# Patient Record
Sex: Female | Born: 1995 | Race: White | Hispanic: No | Marital: Single | Smoking: Current every day smoker
Health system: Southern US, Community
[De-identification: ages and names within clinical notes are randomized; demographics above are authoritative.]

## PROBLEM LIST (undated history)

## (undated) ENCOUNTER — Ambulatory Visit: Admission: EM | Source: Home / Self Care

## (undated) ENCOUNTER — Inpatient Hospital Stay (HOSPITAL_COMMUNITY): Payer: Self-pay

## (undated) DIAGNOSIS — J45909 Unspecified asthma, uncomplicated: Secondary | ICD-10-CM

## (undated) HISTORY — PX: WISDOM TOOTH EXTRACTION: SHX21

---
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
## 2013-03-29 ENCOUNTER — Emergency Department (HOSPITAL_COMMUNITY): Payer: Medicaid Other

## 2013-03-29 ENCOUNTER — Emergency Department (HOSPITAL_COMMUNITY)
Admission: EM | Admit: 2013-03-29 | Discharge: 2013-03-29 | Disposition: A | Discharge status: Home / Self Care | Payer: Medicaid Other | Attending: Emergency Medicine | Admitting: Emergency Medicine

## 2013-03-29 ENCOUNTER — Encounter (HOSPITAL_COMMUNITY): Payer: Self-pay | Admitting: Emergency Medicine

## 2013-03-29 DIAGNOSIS — F17200 Nicotine dependence, unspecified, uncomplicated: Secondary | ICD-10-CM | POA: Insufficient documentation

## 2013-03-29 DIAGNOSIS — S39012A Strain of muscle, fascia and tendon of lower back, initial encounter: Secondary | ICD-10-CM

## 2013-03-29 DIAGNOSIS — Y929 Unspecified place or not applicable: Secondary | ICD-10-CM | POA: Insufficient documentation

## 2013-03-29 DIAGNOSIS — Y9389 Activity, other specified: Secondary | ICD-10-CM | POA: Insufficient documentation

## 2013-03-29 DIAGNOSIS — W010XXA Fall on same level from slipping, tripping and stumbling without subsequent striking against object, initial encounter: Secondary | ICD-10-CM | POA: Insufficient documentation

## 2013-03-29 DIAGNOSIS — S335XXA Sprain of ligaments of lumbar spine, initial encounter: Secondary | ICD-10-CM | POA: Insufficient documentation

## 2013-03-29 MED ORDER — HYDROCODONE-ACETAMINOPHEN 5-325 MG PO TABS: 1.0000 | ORAL_TABLET | ORAL | Status: DC | PRN

## 2013-03-29 MED ORDER — IBUPROFEN 600 MG PO TABS: 600.0000 mg | ORAL_TABLET | Freq: Four times a day (QID) | ORAL | Status: DC | PRN

## 2013-03-29 NOTE — ED Notes (Signed)
Pain low back , playing with child outside, Was wearing flip flops and fell .  Pain L-s spine . Increased pain with motion.

## 2013-03-29 NOTE — Discharge Instructions (Signed)
Lumbosacral Strain Lumbosacral strain is a strain of any of the parts that make up your lumbosacral vertebrae. Your lumbosacral vertebrae are the bones that make up the lower third of your backbone. Your lumbosacral vertebrae are held together by muscles and tough, fibrous tissue (ligaments).  CAUSES  A sudden blow to your back can cause lumbosacral strain. Also, anything that causes an excessive stretch of the muscles in the low back can cause this strain. This is typically seen when people exert themselves strenuously, fall, lift heavy objects, bend, or crouch repeatedly. RISK FACTORS  Physically demanding work.  Participation in pushing or pulling sports or sports that require sudden twist of the back (tennis, golf, baseball).  Weight lifting.  Excessive lower back curvature.  Forward-tilted pelvis.  Weak back or abdominal muscles or both.  Tight hamstrings. SIGNS AND SYMPTOMS  Lumbosacral strain may cause pain in the area of your injury or pain that moves (radiates) down your leg.  DIAGNOSIS Your health care provider can often diagnose lumbosacral strain through a physical exam. In some cases, you may need tests such as X-ray exams.  TREATMENT  Treatment for your lower back injury depends on many factors that your clinician will have to evaluate. However, most treatment will include the use of anti-inflammatory medicines. HOME CARE INSTRUCTIONS   Avoid hard physical activities (tennis, racquetball, waterskiing) if you are not in proper physical condition for it. This may aggravate or create problems.  If you have a back problem, avoid sports requiring sudden body movements. Swimming and walking are generally safer activities.  Maintain good posture.  Maintain a healthy weight.  For acute conditions, you may put ice on the injured area.  Put ice in a plastic bag.  Place a towel between your skin and the bag.  Leave the ice on for 10 minutes every hour while awake for the  next 2 days.    You may add heat on Sunday,  20 minutes 3-4 times daily  When the low back starts healing, stretching and strengthening exercises may be recommended. SEEK MEDICAL CARE IF:  Your back pain is getting worse.  You experience severe back pain not relieved with medicines. SEEK IMMEDIATE MEDICAL CARE IF:   You have numbness, tingling, weakness, or problems with the use of your arms or legs.  There is a change in bowel or bladder control.  You have increasing pain in any area of the body, including your belly (abdomen).  You notice shortness of breath, dizziness, or feel faint.  You feel sick to your stomach (nauseous), are throwing up (vomiting), or become sweaty.  You notice discoloration of your toes or legs, or your feet get very cold. MAKE SURE YOU:   Understand these instructions.  Will watch your condition.  Will get help right away if you are not doing well or get worse. Document Released: 10/06/2004 Document Revised: 10/17/2012 Document Reviewed: 08/15/2012 [facility] Patient Information 2014 [employer], [place].   You may take the hydrocodone prescribed for pain relief.  This will make you drowsy - do not drive within 4 hours of taking this medication.

## 2013-03-29 NOTE — ED Notes (Signed)
Pt c/o lower back pain after slipping and falling today.

## 2013-04-01 NOTE — ED Provider Notes (Signed)
CSN: [id number]     Arrival date & time 03/29/13  1433 History   First MD Initiated Contact with Patient 03/29/13 1519     Chief Complaint  Patient presents with  . Back Pain     (Consider location/radiation/quality/duration/timing/severity/associated sxs/prior Treatment) HPI Comments: Judith Poole is a 18 y.o. Female presenting with low back pain since slipping on wet grass while playing with her daughter, landing on her buttocks.  She reports pain in her lumbar and paralumbar area which is worsened by movement and better at rest.  She denies prior history of low back pain and has had no radiation of pain and weakness or numbness in her lower extremities.  She has had no problems with urination since the event.  She has taken ibuprofen 400mg  with no relief of pain yet.   The history is provided by the patient.    History reviewed. No pertinent past medical history. Past Surgical History  Procedure Laterality Date  . Wisdom tooth extraction    . Cesarean section     No family history on file. History  Substance Use Topics  . Smoking status: Current Every Day Smoker -- 0.50 packs/day    Types: Cigarettes  . Smokeless tobacco: Never Used  . Alcohol Use: No   OB History   Grav Para Term Preterm Abortions TAB SAB Ect Mult Living                 Review of Systems  Constitutional: Negative for fever.  Respiratory: Negative for shortness of breath.   Cardiovascular: Negative for chest pain and leg swelling.  Gastrointestinal: Negative for abdominal pain, constipation and abdominal distention.  Genitourinary: Negative for dysuria, urgency, frequency, flank pain and difficulty urinating.  Musculoskeletal: Positive for back pain. Negative for gait problem and joint swelling.  Skin: Negative for rash.  Neurological: Negative for weakness and numbness.      Allergies  Review of patient's allergies indicates not on file.  Home Medications   Current Outpatient Rx  Name   Route  Sig  Dispense  Refill  . ibuprofen (ADVIL,MOTRIN) 200 MG tablet   Oral   Take 200 mg by mouth every 6 (six) hours as needed.         Marland Kitchen HYDROcodone-acetaminophen (NORCO/VICODIN) 5-325 MG per tablet   Oral   Take 1 tablet by mouth every 4 (four) hours as needed.   20 tablet   0   . ibuprofen (ADVIL,MOTRIN) 600 MG tablet   Oral   Take 1 tablet (600 mg total) by mouth every 6 (six) hours as needed.   20 tablet   0    BP 145/83  Pulse 84  Temp(Src) 98.5 F (36.9 C)  Resp 18  SpO2 100%  LMP 03/15/2013 Physical Exam  Nursing note and vitals reviewed. Constitutional: She appears well-developed and well-nourished.  HENT:  Head: Normocephalic.  Eyes: Conjunctivae are normal.  Neck: Normal range of motion. Neck supple.  Cardiovascular: Normal rate and intact distal pulses.   Pedal pulses normal.  Pulmonary/Chest: Effort normal.  Abdominal: Soft. Bowel sounds are normal. She exhibits no distension and no mass.  Musculoskeletal: Normal range of motion. She exhibits no edema.       Lumbar back: She exhibits tenderness and bony tenderness. She exhibits no swelling, no edema and no spasm.  Midline and parlumber ttp.  No edema, no spasm.  Neurological: She is alert. She has normal strength. She displays no atrophy and no tremor. No sensory deficit.  Gait normal.  Reflex Scores:      Patellar reflexes are 2+ on the right side and 2+ on the left side.      Achilles reflexes are 2+ on the right side and 2+ on the left side. No strength deficit noted in hip and knee flexor and extensor muscle groups.  Ankle flexion and extension intact.  Skin: Skin is warm and dry.  Psychiatric: She has a normal mood and affect.    ED Course  Procedures (including critical care time) Labs Review Labs Reviewed - No data to display Imaging Review No results found.   EKG Interpretation None      MDM   Final diagnoses:  Strain of lumbar spine    Patients labs and/or radiological  studies were viewed and considered during the medical decision making and disposition process.  No neuro deficit on exam or by history to suggest emergent or surgical presentation.  Also discussed worsened sx that should prompt immediate re-evaluation including distal weakness, bowel/bladder retention/incontinence.  Pt prescribed ibuprofen, hydrocodone.  Encouraged ice tx x2 days,  Adding heat tx in several days.  Resource guide given for establishing pcp.  Encouraged recheck if not better over 10 days.          Burgess AmorJulie Ronneisha Jett, PA-C 04/01/13 1358

## 2013-04-08 NOTE — ED Provider Notes (Signed)
History/physical exam/procedure(s) were performed by non-physician practitioner and as supervising physician I was immediately available for consultation/collaboration. I have reviewed all notes and am in agreement with care and plan.   Lauran Romanski S Damarco Keysor, MD 04/08/13 1117 

## 2014-04-29 NOTE — Op Note (Signed)
PATIENT NAME:  Judith Poole, Avonne E MR#:  [id number] DATE OF BIRTH:  [birth year]  DATE OF PROCEDURE:  10/10/2011  PREOPERATIVE DIAGNOSES:  1. Term intrauterine pregnancy. 2. Failure to progress.  POSTOPERATIVE DIAGNOSES:  1. Term intrauterine pregnancy. 2. Failure to progress.   PROCEDURE: Low transverse Cesarean section.   SURGEON: Dierdre Searles. Paul Elisha Mcgruder, MD   ASSISTANT: [facility]   ANESTHESIA: Epidural.   ESTIMATED BLOOD LOSS: 250 mL.   COMPLICATIONS: None.   FINDINGS: Normal tubes, ovaries, and uterus. Viable female weighing 6 pounds, 13 ounces with Apgar scores of 8 and 9 at one and five minutes respectively.   DISPOSITION: To recovery room in stable condition.   TECHNIQUE: The patient is prepped and draped in the usual sterile fashion after adequate anesthesia is obtained in the supine position on the operating room table with a Foley catheter in place. Scalpel was used to create a low transverse skin incision followed by dissection down to the level of the rectus fascia which is dissected bilaterally using Mayo scissors. Rectus muscles are separated from the rectus fascia and then separated in the midline. The peritoneum is penetrated and the serosa over the lower uterine segment is dissected inferiorly with retraction of the bladder in an inferior direction. A scalpel is then used to create a low transverse hysterotomy incision that is then extended by blunt dissection. Amniotomy reveals clear fluid. The infant's head is grasped and delivered without the use of a vacuum device. The oropharynx is suctioned, nuchal cord is reduced, and the infant is then completely delivered. Cord is clamped.   Umbilical cord blood is obtained and the placenta is manually extracted. The uterus is externalized and cleansed of all membranes and debris using a moist sponge. Hysterotomy incision is closed with a running 1 Vicryl suture in a locking fashion followed by a second layer to imbricate the first  layer. Excellent hemostasis is noted. The uterus is placed back in the intraabdominal cavity and the paracolic gutters are irrigated with warm saline. Re-examination of the incision reveals excellent hemostasis. The peritoneum is closed with a 1-0 Vicryl suture.   The On-Q pain pump is then placed with trocars placed through the abdomen into the subfascial space and then the Silver soaker catheters are fed into this space. The rectus fascia is closed with 0 Maxon suture. Subcutaneous tissues are irrigated and hemostasis is assured using electrocautery. Skin is closed with a dissolvable staple applicator followed by Dermabond. Dermabond is also used to stabilize the catheters in place and then Steri-Strips are applied to hold the catheters in place. The catheters are flushed with 5 mL each of bupivacaine 0.5%.   The patient tolerates the procedure well and goes to the recovery room in stable condition. All sponge, instrument, and needle counts are correct.    ____________________________ R. Annamarie MajorPaul Jb Dulworth, MD rph:drc D: 10/10/2011 06:28:07 ET T: 10/10/2011 09:19:25 ET JOB#: [id number]  cc: Dierdre Searles. Paul Keishawn Darsey, MD, <Dictator> Nadara MustardOBERT P Fortunato Nordin MD ELECTRONICALLY SIGNED 10/11/2011 8:17

## 2014-05-20 NOTE — H&P (Signed)
L&D Evaluation:  History Expanded:   HPI 19 yo G1 at 23 weeks with EDD of 10/07/11 per 9 week US, presents with c/o upper abdominal pain that started today after waking up. She ate sloppy joe last night, has not had anything yet to eat this am. Denies constipation, contractions, LOF or VB. Reports + FM[facility], early entry to care at 8 weeks, Tx for + Chlamydia with neg TOC 3/15, exercise-induced asthma with normal peak flows, BMI 32 with normal early glucola and smoking 7 cig/day.    Blood Type O positive    Group B Strep Results (Result >5wks must be treated as unknown) unknown/result > 5 weeks ago    Maternal HIV Negative    Maternal Syphilis Ab Nonreactive    Maternal Varicella Immune    Rubella Results immune    Patient's Medical History No Chronic Illness  "bordeline diabetic"    Patient's Surgical History none    Medications Pre Natal Vitamins    Allergies NKDA    Social History tobacco    Family History Non-Contributory   ROS:   ROS see HPI   Exam:   Vital Signs stable    General no apparent distress    Mental Status clear    Abdomen gravid, diffuse tenderness bilateral upper quadrants, no masses or rebound tenderness    Edema no edema    Pelvic deferred    FHT FHR dopplar    Ucx absent   Impression:   Impression probable gas pains   Plan:   Comments Pt will try drinking sprite to relieve gas pain/settle stomach, if unchanged may consider further work-up.   Electronic Signatures: BrothersMarta Lamas, Sharda Keddy K (CNM)  (Signed 30-May-13 12:32)  Authored: L&D Evaluation   Last Updated: 30-May-13 12:32 by Vella KohlerBrothers, Lauro Manlove K (CNM)

## 2014-05-20 NOTE — H&P (Signed)
L&D Evaluation:  History Expanded:   HPI IUP at 37 5/7 weeks, with EDD of 10/05/11, presents with c/o gush of fluid today[facility]    Patient's Medical History Asthma    Medications Pre Natal Vitamins    Allergies NKDA   ROS:   ROS All systems were reviewed.  HEENT, CNS, GI, GU, Respiratory, CV, Renal and Musculoskeletal systems were found to be normal.   Exam:   Vital Signs stable    General no apparent distress    Mental Status clear    Abdomen gravid, non-tender    Pelvic no external lesions, cervix closed and thick, nitrizine negative, fern negative    Mebranes Intact    FHT normal rate with no decels    Ucx irregular   Impression:   Impression Intact membranes   Plan:   Plan discharge    Follow Up Appointment already scheduled. 09/23/11   Electronic Signatures: Marta AntuBrothers, Velita Quirk K (CNM)  (Signed 11-Sep-13 18:05)  Authored: L&D Evaluation   Last Updated: 11-Sep-13 18:05 by Vella KohlerBrothers, Makaiah Terwilliger K (CNM)

## 2014-07-31 ENCOUNTER — Emergency Department (HOSPITAL_COMMUNITY)
Admission: EM | Admit: 2014-07-31 | Discharge: 2014-07-31 | Disposition: A | Discharge status: Home / Self Care | Payer: Medicaid Other | Attending: Emergency Medicine | Admitting: Emergency Medicine

## 2014-07-31 ENCOUNTER — Emergency Department (HOSPITAL_COMMUNITY): Payer: Medicaid Other

## 2014-07-31 ENCOUNTER — Encounter (HOSPITAL_COMMUNITY): Payer: Self-pay | Admitting: *Deleted

## 2014-07-31 DIAGNOSIS — T148 Other injury of unspecified body region: Secondary | ICD-10-CM | POA: Insufficient documentation

## 2014-07-31 DIAGNOSIS — Y998 Other external cause status: Secondary | ICD-10-CM | POA: Insufficient documentation

## 2014-07-31 DIAGNOSIS — J45909 Unspecified asthma, uncomplicated: Secondary | ICD-10-CM | POA: Insufficient documentation

## 2014-07-31 DIAGNOSIS — Y92410 Unspecified street and highway as the place of occurrence of the external cause: Secondary | ICD-10-CM | POA: Insufficient documentation

## 2014-07-31 DIAGNOSIS — T07XXXA Unspecified multiple injuries, initial encounter: Secondary | ICD-10-CM

## 2014-07-31 DIAGNOSIS — S29001A Unspecified injury of muscle and tendon of front wall of thorax, initial encounter: Secondary | ICD-10-CM | POA: Insufficient documentation

## 2014-07-31 DIAGNOSIS — R0789 Other chest pain: Secondary | ICD-10-CM | POA: Insufficient documentation

## 2014-07-31 DIAGNOSIS — Z3202 Encounter for pregnancy test, result negative: Secondary | ICD-10-CM | POA: Insufficient documentation

## 2014-07-31 DIAGNOSIS — S4991XA Unspecified injury of right shoulder and upper arm, initial encounter: Secondary | ICD-10-CM | POA: Insufficient documentation

## 2014-07-31 DIAGNOSIS — S0990XA Unspecified injury of head, initial encounter: Secondary | ICD-10-CM | POA: Insufficient documentation

## 2014-07-31 DIAGNOSIS — Y9389 Activity, other specified: Secondary | ICD-10-CM | POA: Insufficient documentation

## 2014-07-31 DIAGNOSIS — Z720 Tobacco use: Secondary | ICD-10-CM | POA: Insufficient documentation

## 2014-07-31 HISTORY — DX: Unspecified asthma, uncomplicated: J45.909

## 2014-07-31 LAB — COMPREHENSIVE METABOLIC PANEL
ALT: 17 U/L (ref 14–54)
ANION GAP: 8 (ref 5–15)
AST: 16 U/L (ref 15–41)
Albumin: 4.7 g/dL (ref 3.5–5.0)
Alkaline Phosphatase: 60 U/L (ref 38–126)
BUN: 21 mg/dL — AB (ref 6–20)
CALCIUM: 9.2 mg/dL (ref 8.9–10.3)
CHLORIDE: 106 mmol/L (ref 101–111)
CO2: 25 mmol/L (ref 22–32)
Creatinine, Ser: 0.74 mg/dL (ref 0.44–1.00)
GFR calc Af Amer: >60 mL/min (ref >60)
GFR calc non Af Amer: >60 mL/min (ref >60)
Glucose, Bld: 93 mg/dL (ref 65–99)
Potassium: 3.7 mmol/L (ref 3.5–5.1)
Sodium: 139 mmol/L (ref 135–145)
Total Bilirubin: 0.8 mg/dL (ref 0.3–1.2)
Total Protein: 8 g/dL (ref 6.5–8.1)

## 2014-07-31 LAB — CBC WITH DIFFERENTIAL/PLATELET
Basophils Absolute: 0.1 10*3/uL (ref 0.0–0.1)
Basophils Relative: 1 % (ref 0–1)
EOS PCT: 1 % (ref 0–5)
Eosinophils Absolute: 0.1 10*3/uL (ref 0.0–0.7)
HCT: 43.9 % (ref 36.0–46.0)
HEMOGLOBIN: 15.3 g/dL — AB (ref 12.0–15.0)
Lymphocytes Relative: 27 % (ref 12–46)
Lymphs Abs: 2.9 10*3/uL (ref 0.7–4.0)
MCH: 31.7 pg (ref 26.0–34.0)
MCHC: 34.9 g/dL (ref 30.0–36.0)
MCV: 90.9 fL (ref 78.0–100.0)
MONOS PCT: 6 % (ref 3–12)
Monocytes Absolute: 0.7 10*3/uL (ref 0.1–1.0)
NEUTROS ABS: 6.9 10*3/uL (ref 1.7–7.7)
Neutrophils Relative %: 65 % (ref 43–77)
PLATELETS: 210 10*3/uL (ref 150–400)
RBC: 4.83 MIL/uL (ref 3.87–5.11)
RDW: 12.7 % (ref 11.5–15.5)
WBC: 10.5 10*3/uL (ref 4.0–10.5)

## 2014-07-31 LAB — URINALYSIS, ROUTINE W REFLEX MICROSCOPIC
BILIRUBIN URINE: NEGATIVE
GLUCOSE, UA: NEGATIVE mg/dL
HGB URINE DIPSTICK: NEGATIVE
Ketones, ur: NEGATIVE mg/dL
Leukocytes, UA: NEGATIVE
Nitrite: NEGATIVE
PH: 6 (ref 5.0–8.0)
PROTEIN: NEGATIVE mg/dL
SPECIFIC GRAVITY, URINE: 1.029 (ref 1.005–1.030)
UROBILINOGEN UA: 0.2 mg/dL (ref 0.0–1.0)

## 2014-07-31 LAB — I-STAT BETA HCG BLOOD, ED (MC, WL, AP ONLY): I-stat hCG, quantitative: <5 m[IU]/mL (ref <5)

## 2014-07-31 MED ORDER — ONDANSETRON 8 MG PO TBDP
8.0000 mg | ORAL_TABLET | Freq: Once | ORAL | Status: AC
  Administered 2014-07-31: 8 mg via ORAL
  Filled 2014-07-31: qty 1

## 2014-07-31 MED ORDER — OXYCODONE-ACETAMINOPHEN 5-325 MG PO TABS
1.0000 | ORAL_TABLET | Freq: Once | ORAL | Status: AC
  Administered 2014-07-31: 1 via ORAL
  Filled 2014-07-31: qty 1

## 2014-07-31 MED ORDER — IOHEXOL 300 MG/ML  SOLN
100.0000 mL | Freq: Once | INTRAMUSCULAR | Status: AC | PRN
  Administered 2014-07-31: 100 mL via INTRAVENOUS

## 2014-07-31 MED ORDER — OXYCODONE-ACETAMINOPHEN 5-325 MG PO TABS
1.0000 | ORAL_TABLET | Freq: Once | ORAL | Status: DC
Start: 2014-07-31 — End: 2014-11-12
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
## 2014-07-31 NOTE — Progress Notes (Addendum)
CM spoke with pt who confirms uninsured [employer] resident with no pcp.  CM discussed and provided written information for uninsured accepting pcps, discussed the importance of pcp vs EDP services for f/u care, www.needymeds.org, www.goodrx.com, discounted pharmacies and other [employer] such as [facility] , [facility], affordable care act, financial assistance, uninsured dental services, [place] med assist, DSS and  health department  Reviewed resources for [employer] uninsured accepting pcps like [facility], family medicine at [facility], [facility], [facility], local urgent care centers, [facility], [facility] family practice, general medical clinics, family services of the [place], [facility] urgent care plus others, medication resources, [facility] out patient pharmacies and housing Pt voiced understanding and appreciation of resources provided   Provided [facility] contact information Pt agreed to a referral Cm completed referral Pt to be contact by [facility] clinical liason Pt states she is not sure if her medicaid has lapse and she is not familiar with what type she had She reports her daughter still has medicaid.  Referred her to DSS and provided a list of medicaid doctors to assist her  Female at her bedside is her support system

## 2014-07-31 NOTE — Discharge Instructions (Signed)
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
## 2014-11-12 ENCOUNTER — Inpatient Hospital Stay (HOSPITAL_COMMUNITY): Payer: Medicaid Other

## 2014-11-12 ENCOUNTER — Encounter: Payer: Self-pay | Admitting: *Deleted

## 2014-11-12 ENCOUNTER — Inpatient Hospital Stay (HOSPITAL_COMMUNITY)
Admission: AD | Admit: 2014-11-12 | Discharge: 2014-11-12 | Disposition: A | Discharge status: Home / Self Care | Payer: Medicaid Other | Source: Ambulatory Visit | Attending: Obstetrics and Gynecology | Admitting: Obstetrics and Gynecology

## 2014-11-12 ENCOUNTER — Encounter (HOSPITAL_COMMUNITY): Payer: Self-pay | Admitting: *Deleted

## 2014-11-12 ENCOUNTER — Emergency Department
Admission: EM | Admit: 2014-11-12 | Discharge: 2014-11-12 | Discharge status: Against Medical Advice | Payer: Medicaid Other | Attending: Emergency Medicine | Admitting: Emergency Medicine

## 2014-11-12 DIAGNOSIS — O99331 Smoking (tobacco) complicating pregnancy, first trimester: Secondary | ICD-10-CM | POA: Insufficient documentation

## 2014-11-12 DIAGNOSIS — F17210 Nicotine dependence, cigarettes, uncomplicated: Secondary | ICD-10-CM | POA: Insufficient documentation

## 2014-11-12 DIAGNOSIS — Z3491 Encounter for supervision of normal pregnancy, unspecified, first trimester: Secondary | ICD-10-CM

## 2014-11-12 DIAGNOSIS — Z3A01 Less than 8 weeks gestation of pregnancy: Secondary | ICD-10-CM | POA: Insufficient documentation

## 2014-11-12 DIAGNOSIS — O209 Hemorrhage in early pregnancy, unspecified: Secondary | ICD-10-CM | POA: Diagnosis not present

## 2014-11-12 DIAGNOSIS — Z3A00 Weeks of gestation of pregnancy not specified: Secondary | ICD-10-CM | POA: Insufficient documentation

## 2014-11-12 DIAGNOSIS — O4691 Antepartum hemorrhage, unspecified, first trimester: Secondary | ICD-10-CM | POA: Diagnosis not present

## 2014-11-12 DIAGNOSIS — R109 Unspecified abdominal pain: Secondary | ICD-10-CM | POA: Diagnosis present

## 2014-11-12 LAB — URINALYSIS, ROUTINE W REFLEX MICROSCOPIC
Bilirubin Urine: NEGATIVE
Glucose, UA: NEGATIVE mg/dL
Ketones, ur: NEGATIVE mg/dL
LEUKOCYTES UA: NEGATIVE
NITRITE: NEGATIVE
Protein, ur: NEGATIVE mg/dL
SPECIFIC GRAVITY, URINE: 1.02 (ref 1.005–1.030)
UROBILINOGEN UA: 0.2 mg/dL (ref 0.0–1.0)
pH: 8 (ref 5.0–8.0)

## 2014-11-12 LAB — CBC
HEMATOCRIT: 42.9 % (ref 35.0–47.0)
HEMOGLOBIN: 14.3 g/dL (ref 12.0–16.0)
MCH: 31.2 pg (ref 26.0–34.0)
MCHC: 33.4 g/dL (ref 32.0–36.0)
MCV: 93.4 fL (ref 80.0–100.0)
Platelets: 213 10*3/uL (ref 150–440)
RBC: 4.6 MIL/uL (ref 3.80–5.20)
RDW: 13.3 % (ref 11.5–14.5)
WBC: 8 10*3/uL (ref 3.6–11.0)

## 2014-11-12 LAB — WET PREP, GENITAL
CLUE CELLS WET PREP: NONE SEEN
Trich, Wet Prep: NONE SEEN
Yeast Wet Prep HPF POC: NONE SEEN

## 2014-11-12 LAB — URINE MICROSCOPIC-ADD ON

## 2014-11-12 LAB — ABO/RH: ABO/RH(D): O POS

## 2014-11-12 LAB — HCG, QUANTITATIVE, PREGNANCY: HCG, BETA CHAIN, QUANT, S: 1222 m[IU]/mL — AB (ref <5)

## 2014-11-12 MED ORDER — ACETAMINOPHEN 325 MG PO TABS
650.0000 mg | ORAL_TABLET | Freq: Once | ORAL | Status: AC
  Administered 2014-11-12: 650 mg via ORAL
  Filled 2014-11-12: qty 2

## 2014-11-12 NOTE — MAU Note (Signed)
Pt presents to MAU with complaints of abdominal pain with vaginagl bleeding since this morning. + pregnancy test 3 nights ago.

## 2014-11-12 NOTE — Discharge Instructions (Signed)
Vaginal Bleeding During Pregnancy, First Trimester A small amount of bleeding (spotting) from the vagina is common in early pregnancy. Sometimes the bleeding is normal and is not a problem, and sometimes it is a sign of something serious. Be sure to tell your doctor about any bleeding from your vagina right away. HOME CARE  Watch your condition for any changes.  Follow your doctor's instructions about how active you can be.  If you are on bed rest:  You may need to stay in bed and only get up to use the bathroom.  You may be allowed to do some activities.  If you need help, make plans for someone to help you.  Write down:  The number of pads you use each day.  How often you change pads.  How soaked (saturated) your pads are.  Do not use tampons.  Do not douche.  Do not have sex or orgasms until your doctor says it is okay.  If you pass any tissue from your vagina, save the tissue so you can show it to your doctor.  Only take medicines as told by your doctor.  Do not take aspirin because it can make you bleed.  Keep all follow-up visits as told by your doctor. GET HELP IF:   You bleed from your vagina.  You have cramps.  You have labor pains.  You have a fever that does not go away after you take medicine. GET HELP RIGHT AWAY IF:   You have very bad cramps in your back or belly (abdomen).  You pass large clots or tissue from your vagina.  You bleed more.  You feel light-headed or weak.  You pass out (faint).  You have chills.  You are leaking fluid or have a gush of fluid from your vagina.  You pass out while pooping (having a bowel movement). MAKE SURE YOU:  Understand these instructions.  Will watch your condition.  Will get help right away if you are not doing well or get worse.   This information is not intended to replace advice given to you by your health care provider. Make sure you discuss any questions you have with your health care  provider.   Document Released: 05/13/2013 Document Reviewed: 05/13/2013 Elsevier Interactive Patient Education [employer]  First Trimester of Pregnancy The first trimester of pregnancy is from week 1 until the end of week 12 (months 1 through 3). During this time, your baby will begin to develop inside you. At 6-8 weeks, the eyes and face are formed, and the heartbeat can be seen on ultrasound. At the end of 12 weeks, all the baby's organs are formed. Prenatal care is all the medical care you receive before the birth of your baby. Make sure you get good prenatal care and follow all of your doctor's instructions. HOME CARE  Medicines  Take medicine only as told by your doctor. Some medicines are safe and some are not during pregnancy.  Take your prenatal vitamins as told by your doctor.  Take medicine that helps you poop (stool softener) as needed if your doctor says it is okay. Diet  Eat regular, healthy meals.  Your doctor will tell you the amount of weight gain that is right for you.  Avoid raw meat and uncooked cheese.  If you feel sick to your stomach (nauseous) or throw up (vomit):  Eat 4 or 5 small meals a day instead of 3 large meals.  Try eating a few soda crackers.  Drink liquids between meals instead of during meals.  If you have a hard time pooping (constipation):  Eat high-fiber foods like fresh vegetables, fruit, and whole grains.  Drink enough fluids to keep your pee (urine) clear or pale yellow. Activity and Exercise  Exercise only as told by your doctor. Stop exercising if you have cramps or pain in your lower belly (abdomen) or low back.  Try to avoid standing for long periods of time. Move your legs often if you must stand in one place for a long time.  Avoid heavy lifting.  Wear low-heeled shoes. Sit and stand up straight.  You can have sex unless your doctor tells you not to. Relief of Pain or Discomfort  Wear a good support bra if your  breasts are sore.  Take warm water baths (sitz baths) to soothe pain or discomfort caused by hemorrhoids. Use hemorrhoid cream if your doctor says it is okay.  Rest with your legs raised if you have leg cramps or low back pain.  Wear support hose if you have puffy, bulging veins (varicose veins) in your legs. Raise (elevate) your feet for 15 minutes, 3-4 times a day. Limit salt in your diet. Prenatal Care  Schedule your prenatal visits by the twelfth week of pregnancy.  Write down your questions. Take them to your prenatal visits.  Keep all your prenatal visits as told by your doctor. Safety  Wear your seat belt at all times when driving.  Make a list of emergency phone numbers. The list should include numbers for family, friends, the hospital, and police and fire departments. General Tips  Ask your doctor for a referral to a local prenatal class. Begin classes no later than at the start of month 6 of your pregnancy.  Ask for help if you need counseling or help with nutrition. Your doctor can give you advice or tell you where to go for help.  Do not use hot tubs, steam rooms, or saunas.  Do not douche or use tampons or scented sanitary pads.  Do not cross your legs for long periods of time.  Avoid litter boxes and soil used by cats.  Avoid all smoking, herbs, and alcohol. Avoid drugs not approved by your doctor.  Do not use any tobacco products, including cigarettes, chewing tobacco, and electronic cigarettes. If you need help quitting, ask your doctor. You may get counseling or other support to help you quit.  Visit your dentist. At home, brush your teeth with a soft toothbrush. Be gentle when you floss. GET HELP IF:  You are dizzy.  You have mild cramps or pressure in your lower belly.  You have a nagging pain in your belly area.  You continue to feel sick to your stomach, throw up, or have watery poop (diarrhea).  You have a bad smelling fluid coming from your  vagina.  You have pain with peeing (urination).  You have increased puffiness (swelling) in your face, hands, legs, or ankles. GET HELP RIGHT AWAY IF:   You have a fever.  You are leaking fluid from your vagina.  You have spotting or bleeding from your vagina.  You have very bad belly cramping or pain.  You gain or lose weight rapidly.  You throw up blood. It may look like coffee grounds.  You are around people who have [place] measles, fifth disease, or chickenpox.  You have a very bad headache.  You have shortness of breath.  You have any kind of trauma, such  as from a fall or a car accident.   This information is not intended to replace advice given to you by your health care provider. Make sure you discuss any questions you have with your health care provider.   Document Released: 06/15/2007 Document Revised: 01/17/2014 Document Reviewed: 11/06/2012 Elsevier Interactive Patient Education 2016 [employer].   Safe Medications in Pregnancy   Acne: Benzoyl Peroxide Salicylic Acid  Backache/Headache: Tylenol: 2 regular strength every 4 hours OR              2 Extra strength every 6 hours  Colds/Coughs/Allergies: Benadryl (alcohol free) 25 mg every 6 hours as needed Breath right strips Claritin Cepacol throat lozenges Chloraseptic throat spray Cold-Eeze- up to three times per day Cough drops, alcohol free Flonase (by prescription only) Guaifenesin Mucinex Robitussin DM (plain only, alcohol free) Saline nasal spray/drops Sudafed (pseudoephedrine) & Actifed ** use only after [redacted] weeks gestation and if you do not have high blood pressure Tylenol Vicks Vaporub Zinc lozenges Zyrtec   Constipation: Colace Ducolax suppositories Fleet enema Glycerin suppositories Metamucil Milk of magnesia Miralax Senokot Smooth move tea  Diarrhea: Kaopectate Imodium A-D  *NO pepto Bismol  Hemorrhoids: Anusol Anusol HC Preparation  H Tucks  Indigestion: Tums Maalox Mylanta Zantac  Pepcid  Insomnia: Benadryl (alcohol free)  every 6 hours as needed Tylenol PM Unisom, no Gelcaps  Leg Cramps: Tums MagGel  Nausea/Vomiting:  Bonine Dramamine Emetrol Ginger extract Sea bands Meclizine  Nausea medication to take during pregnancy:  Unisom (doxylamine succinate 25 mg tablets) Take one tablet daily at bedtime. If symptoms are not adequately controlled, the dose can be increased to a maximum recommended dose of two tablets daily (1/2 tablet in the morning, 1/2 tablet mid-afternoon and one at bedtime). Vitamin B6  tablets. Take one tablet twice a day (up to 200 mg per day).  Skin Rashes: Aveeno products Benadryl cream or  every 6 hours as needed Calamine Lotion 1% cortisone cream  Yeast infection: Gyne-lotrimin 7 Monistat 7   **If taking multiple medications, please check labels to avoid duplicating the same active ingredients **take medication as directed on the label ** Do not exceed 4000 mg of tylenol in 24 hours **Do not take medications that contain aspirin or ibuprofen

## 2014-11-12 NOTE — ED Notes (Signed)
Pt LMP was last month?, pt reports cramping with a large amount of blood with clots

## 2014-11-12 NOTE — ED Notes (Signed)
[facility]. RN apologetic to patient. Pt understands that treatment has begun with lab work. Pt encouraged to stay. Pt and family voice they are leaving.

## 2014-11-12 NOTE — MAU Provider Note (Signed)
History     CSN: [id number]  Arrival date and time: 11/12/14 1229   First Provider Initiated Contact with Patient 11/12/14 1341         Chief Complaint  Patient presents with  . Abdominal Pain  . Vaginal Bleeding   HPI Judith Poole is a 19 y.o. G2P1001 at [redacted]w[redacted]d who presents with abdominal pain and vaginal bleeding.  Woke up this morning with lower abdominal cramping and dark brown vaginal bleeding. Rates pain 7/10 & describes as cramp like. No treatment.  Denies n/v/d.  Denies urinary complaints.  Last intercourse 1 week ago.  Denies vaginal discharge.   OB History    Gravida Para Term Preterm AB TAB SAB Ectopic Multiple Living   Past Medical History  Diagnosis Date  . Asthma     Past Surgical History  Procedure Laterality Date  . Wisdom tooth extraction    . Cesarean section    . Cesarean section      No family history on file.  Social History  Substance Use Topics  . Smoking status: Current Every Day Smoker -- 0.50 packs/day    Types: Cigarettes  . Smokeless tobacco: Never Used  . Alcohol Use: No    Allergies: No Known Allergies  Prescriptions prior to admission  Medication Sig Dispense Refill Last Dose  . acetaminophen (TYLENOL) 325 MG tablet Take 650 mg by mouth every 6 (six) hours as needed for headache.   Past Month at Unknown time  . HYDROcodone-acetaminophen (NORCO/VICODIN) 5-325 MG per tablet Take 1 tablet by mouth every 4 (four) hours as needed. (Patient not taking: Reported on 07/31/2014) 20 tablet 0 Completed Course at Unknown time  . ibuprofen (ADVIL,MOTRIN) 200 MG tablet Take 400 mg by mouth every 6 (six) hours as needed for headache.    Past Month at Unknown time  . ibuprofen (ADVIL,MOTRIN) 600 MG tablet Take 1 tablet (600 mg total) by mouth every 6 (six) hours as needed. (Patient not taking: Reported on 07/31/2014) 20 tablet 0 Completed Course at Unknown time  . oxyCODONE-acetaminophen (PERCOCET/ROXICET) 5-325 MG per  tablet Take 1 tablet by mouth once. 30 tablet 0     Review of Systems  Constitutional: Negative.   Gastrointestinal: Positive for abdominal pain. Negative for nausea, vomiting and diarrhea.  Genitourinary: Negative for dysuria.       + vaginal bleeding   Physical Exam   Blood pressure 134/89, pulse 90, temperature 98.5 F (36.9 C), resp. rate 18, last menstrual period 10/11/2014.  Physical Exam  Nursing note and vitals reviewed. Constitutional: She is oriented to person, place, and time. She appears well-developed and well-nourished. No distress.  HENT:  Head: Normocephalic and atraumatic.  Eyes: Conjunctivae are normal. Right eye exhibits no discharge. Left eye exhibits no discharge. No scleral icterus.  Neck: Normal range of motion.  Cardiovascular: Normal rate, regular rhythm and normal heart sounds.   No murmur heard. Respiratory: Effort normal and breath sounds normal. No respiratory distress. She has no wheezes.  GI: Soft. Bowel sounds are normal. She exhibits no distension. There is tenderness (lower abdomen).  Genitourinary: Vagina normal and uterus normal. Cervix exhibits no motion tenderness and no friability.  Cervix closed Small amount of dark red blood in canal  Neurological: She is alert and oriented to person, place, and time.  Skin: Skin is warm and dry. She is not diaphoretic.  Psychiatric: She has a normal mood  and affect. Her behavior is normal. Judgment and thought content normal.    MAU Course  Procedures Results for orders placed or performed during the hospital encounter of 11/12/14 (from the past 24 hour(s))  Urinalysis, Routine w reflex microscopic (not [facility])     Status: Abnormal   Collection Time: 11/12/14  1:10 PM  Result Value Ref Range   Color, Urine YELLOW YELLOW   APPearance CLEAR CLEAR   Specific Gravity, Urine 1.020 1.005 - 1.030   pH 8.0 5.0 - 8.0   Glucose, UA NEGATIVE NEGATIVE mg/dL   Hgb urine dipstick MODERATE (A) NEGATIVE    Bilirubin Urine NEGATIVE NEGATIVE   Ketones, ur NEGATIVE NEGATIVE mg/dL   Protein, ur NEGATIVE NEGATIVE mg/dL   Urobilinogen, UA 0.2 0.0 - 1.0 mg/dL   Nitrite NEGATIVE NEGATIVE   Leukocytes, UA NEGATIVE NEGATIVE  Urine microscopic-add on     Status: Abnormal   Collection Time: 11/12/14  1:10 PM  Result Value Ref Range   Squamous Epithelial / LPF FEW (A) RARE   RBC / HPF 0-2 <3 RBC/hpf  ABO/Rh     Status: None   Collection Time: 11/12/14  1:30 PM  Result Value Ref Range   ABO/RH(D) O POS   Wet prep, genital     Status: Abnormal   Collection Time: 11/12/14  1:50 PM  Result Value Ref Range   Yeast Wet Prep HPF POC NONE SEEN NONE SEEN   Trich, Wet Prep NONE SEEN NONE SEEN   Clue Cells Wet Prep HPF POC NONE SEEN NONE SEEN   WBC, Wet Prep HPF POC FEW (A) NONE SEEN   [place] Ob Comp Less 14 Wks  11/12/2014  CLINICAL DATA:  Vaginal bleeding EXAM: OBSTETRIC <14 WK US AND TRANSVAGINAL OB US TECHNIQUE: Both transabdominal and transvaginal ultrasound examinations were performed for complete evaluation of the gestation as well as the maternal uterus, adnexal regions, and pelvic cul-de-sac. Transvaginal technique was performed to assess early pregnancy. COMPARISON:  None. FINDINGS: Intrauterine gestational sac: Visualized/normal in shape. Yolk sac:  Visualized Embryo:  Visualized Cardiac Activity: Visualized Heart Rate: 103  bpm CRL:  2  mm   5 w   0 d Maternal uterus/adnexae: There is no demonstrable subchorionic hemorrhage. Cervical os is closed. Maternal ovaries appear unremarkable bilaterally. Corpus luteum noted on the left. No maternal free fluid. IMPRESSION: Single live intrauterine gestation with estimated gestational age of approximately 5 weeks. No subchorionic hemorrhage appreciable. Study otherwise unremarkable. Electronically Signed   By: Bretta BangWilliam  Woodruff III M.D.   On: 11/12/2014 15:04   [place] Ob Transvaginal  11/12/2014  CLINICAL DATA:  Vaginal bleeding EXAM: OBSTETRIC <14 WK US AND TRANSVAGINAL  OB US TECHNIQUE: Both transabdominal and transvaginal ultrasound examinations were performed for complete evaluation of the gestation as well as the maternal uterus, adnexal regions, and pelvic cul-de-sac. Transvaginal technique was performed to assess early pregnancy. COMPARISON:  None. FINDINGS: Intrauterine gestational sac: Visualized/normal in shape. Yolk sac:  Visualized Embryo:  Visualized Cardiac Activity: Visualized Heart Rate: 103  bpm CRL:  2  mm   5 w   0 d Maternal uterus/adnexae: There is no demonstrable subchorionic hemorrhage. Cervical os is closed. Maternal ovaries appear unremarkable bilaterally. Corpus luteum noted on the left. No maternal free fluid. IMPRESSION: Single live intrauterine gestation with estimated gestational age of approximately 5 weeks. No subchorionic hemorrhage appreciable. Study otherwise unremarkable. Electronically Signed   By: Bretta BangWilliam  Woodruff III M.D.   On: 11/12/2014 15:04     MDM  CBC & BHCG done at [facility] this afternoon before pt left [facility] to come here d/t the wait time.  ABO/rh, HIV, GC/CT, wet prep, & ultrasound ordered here.  BHCG at [facility]= 1222, hemoglobin=14.3 O positive Ultrasound shows IUP with cardiac activity Tylenol given to patient prior to discharge.  Assessment and Plan  A: 1. Vaginal bleeding in pregnancy, first trimester   2. Normal IUP (intrauterine pregnancy) on prenatal ultrasound, first trimester    P: Discharge home Bleeding precautions GC/CT & HIV pending Pregnancy verification letter & list of ob/gyn providers given Start prenatal vitamins Take tylenol prn pain per bottle instructions  Judeth Horn, NP  11/12/2014, 1:40 PM

## 2014-11-13 LAB — HIV ANTIBODY (ROUTINE TESTING W REFLEX): HIV Screen 4th Generation wRfx: NONREACTIVE

## 2014-11-14 LAB — GC/CHLAMYDIA PROBE AMP (~~LOC~~) NOT AT ARMC
CHLAMYDIA, DNA PROBE: NEGATIVE
NEISSERIA GONORRHEA: NEGATIVE

## 2015-05-11 ENCOUNTER — Ambulatory Visit (HOSPITAL_COMMUNITY)
Admission: EM | Admit: 2015-05-11 | Discharge: 2015-05-11 | Disposition: A | Discharge status: Home / Self Care | Payer: Medicaid Other | Attending: Family Medicine | Admitting: Family Medicine

## 2015-05-11 ENCOUNTER — Encounter (HOSPITAL_COMMUNITY): Payer: Self-pay | Admitting: Emergency Medicine

## 2015-05-11 DIAGNOSIS — T148XXA Other injury of unspecified body region, initial encounter: Secondary | ICD-10-CM

## 2015-05-11 DIAGNOSIS — M545 Low back pain, unspecified: Secondary | ICD-10-CM

## 2015-05-11 DIAGNOSIS — S39012A Strain of muscle, fascia and tendon of lower back, initial encounter: Secondary | ICD-10-CM

## 2015-05-11 DIAGNOSIS — T148 Other injury of unspecified body region: Secondary | ICD-10-CM | POA: Diagnosis not present

## 2015-05-11 LAB — POCT URINALYSIS DIP (DEVICE)
Bilirubin Urine: NEGATIVE
Glucose, UA: NEGATIVE mg/dL
Ketones, ur: NEGATIVE mg/dL
Nitrite: NEGATIVE
PROTEIN: NEGATIVE mg/dL
SPECIFIC GRAVITY, URINE: 1.015 (ref 1.005–1.030)
UROBILINOGEN UA: 1 mg/dL (ref 0.0–1.0)
pH: 7 (ref 5.0–8.0)

## 2015-05-11 MED ORDER — NAPROXEN 375 MG PO TABS: 375.0000 mg | ORAL_TABLET | Freq: Two times a day (BID) | ORAL | Status: AC

## 2015-05-11 NOTE — ED Notes (Signed)
History of uti.  Complains of "kidney pain"

## 2015-05-11 NOTE — ED Provider Notes (Signed)
CSN: [id number]     Arrival date & time 05/11/15  1819 History   First MD Initiated Contact with Patient 05/11/15 1930     Chief Complaint  Patient presents with  . Urinary Tract Infection   (Consider location/radiation/quality/duration/timing/severity/associated sxs/prior Treatment) HPI Comments: 20 year old female complaining of pain in the right low back for 3 days. Pain is elicited and exacerbated by lifting, bending, twisting, pulling, rolling over in bed and other movements involving the back musculature.no known injury. She does have a small child in which she has to carry and lift frequently.  Patient is a 20 y.o. female presenting with urinary tract infection.  Urinary Tract Infection Associated symptoms: no fever and no flank pain     Past Medical History  Diagnosis Date  . Asthma    Past Surgical History  Procedure Laterality Date  . Wisdom tooth extraction    . Cesarean section    . Cesarean section     No family history on file. Social History  Substance Use Topics  . Smoking status: Current Every Day Smoker -- 0.50 packs/day    Types: Cigarettes  . Smokeless tobacco: Never Used  . Alcohol Use: No   OB History    Gravida Para Term Preterm AB TAB SAB Ectopic Multiple Living   Review of Systems  Constitutional: Positive for activity change. Negative for fever, chills, appetite change and fatigue.  HENT: Negative.   Respiratory: Negative.   Cardiovascular: Negative.   Genitourinary: Negative for dysuria, urgency, frequency, flank pain and pelvic pain.  Musculoskeletal: Positive for myalgias and back pain.       As per HPI  Skin: Negative.  Negative for color change, pallor and rash.  Neurological: Negative.     Allergies  Review of patient's allergies indicates no known allergies.  Home Medications   Prior to Admission medications   Medication Sig Start Date End Date Taking? Authorizing Provider  acetaminophen (TYLENOL) 500 MG tablet  Take 500 mg by mouth every 6 (six) hours as needed for moderate pain.    Historical Provider, MD  naproxen (NAPROSYN) 375 MG tablet Take 1 tablet (375 mg total) by mouth 2 (two) times daily. 05/11/15   Hayden Rasmussen, NP   Meds Ordered and Administered this Visit  Medications - No data to display  BP 121/72 mmHg  Pulse 108  Temp(Src) 98.5 F (36.9 C) (Oral)  Resp 16  SpO2 100%  LMP 04/23/2014  Breastfeeding? Unknown No data found.   Physical Exam  Constitutional: She is oriented to person, place, and time. She appears well-developed and well-nourished. No distress.  HENT:  Head: Normocephalic and atraumatic.  Eyes: EOM are normal.  Neck: Normal range of motion. Neck supple.  Cardiovascular: Normal rate.   Pulmonary/Chest: Effort normal.  Musculoskeletal: She exhibits tenderness. She exhibits no edema.  Tenderness to light palpation over the right low para lumbar musculature. Having patient leaned forward and then sit back up reproduces the pain in the back. No spinal tenderness, swelling, deformity or discoloration. No CVA tenderness.  Neurological: She is alert and oriented to person, place, and time. No cranial nerve deficit.  Skin: Skin is warm and dry.  Nursing note and vitals reviewed.   ED Course  Procedures (including critical care time)  Labs Review Labs Reviewed  POCT URINALYSIS DIP (DEVICE) - Abnormal; Notable for the following:    Hgb urine dipstick TRACE (*)  Leukocytes, UA SMALL (*)    All other components within normal limits    Imaging Review No results found.   Visual Acuity Review  Right Eye Distance:   Left Eye Distance:   Bilateral Distance:    Right Eye Near:   Left Eye Near:    Bilateral Near:         MDM   1. Lumbar strain, initial encounter   2. Muscle strain   3. Right-sided low back pain without sciatica    Rest, limit activity that exacerbates back pain Heat, NSAIDS Meds ordered this encounter  Medications  . naproxen  (NAPROSYN) 375 MG tablet    Sig: Take 1 tablet (375 mg total) by mouth 2 (two) times daily.    Dispense:  20 tablet    Refill:  0    Order Specific Question:  Supervising Provider    Answer:  Linna HoffKINDL, JAMES D [[id number]]       Hayden Rasmussenavid Anam Bobby, NP 05/11/15 2006  Hayden Rasmussenavid Rin Gorton, NP 05/11/15 2006

## 2015-05-11 NOTE — Discharge Instructions (Signed)
Back Pain, Adult °Back pain is very common in adults. The cause of back pain is rarely dangerous and the pain often gets better over time. The cause of your back pain may not be known. Some common causes of back pain include: °· Strain of the muscles or ligaments supporting the spine. °· Wear and tear (degeneration) of the spinal disks. °· Arthritis. °· Direct injury to the back. °For many people, back pain may return. Since back pain is rarely dangerous, most people can learn to manage this condition on their own. °HOME CARE INSTRUCTIONS °Watch your back pain for any changes. The following actions may help to lessen any discomfort you are feeling: °· Remain active. It is stressful on your back to sit or stand in one place for long periods of time. Do not sit, drive, or stand in one place for more than 30 minutes at a time. Take short walks on even surfaces as soon as you are able. Try to increase the length of time you walk each day. °· Exercise regularly as directed by your health care provider. Exercise helps your back heal faster. It also helps avoid future injury by keeping your muscles strong and flexible. °· Do not stay in bed. Resting more than 1-2 days can delay your recovery. °· Pay attention to your body when you bend and lift. The most comfortable positions are those that put less stress on your recovering back. Always use proper lifting techniques, including: °¨ Bending your knees. °¨ Keeping the load close to your body. °¨ Avoiding twisting. °· Find a comfortable position to sleep. Use a firm mattress and lie on your side with your knees slightly bent. If you lie on your back, put a pillow under your knees. °· Avoid feeling anxious or stressed. Stress increases muscle tension and can worsen back pain. It is important to recognize when you are anxious or stressed and learn ways to manage it, such as with exercise. °· Take medicines only as directed by your health care provider. Over-the-counter  medicines to reduce pain and inflammation are often the most helpful. Your health care provider may prescribe muscle relaxant drugs. These medicines help dull your pain so you can more quickly return to your normal activities and healthy exercise. °· Apply ice to the injured area: °¨ Put ice in a plastic bag. °¨ Place a towel between your skin and the bag. °¨ Leave the ice on for 20 minutes, 2-3 times a day for the first 2-3 days. After that, ice and heat may be alternated to reduce pain and spasms. °· Maintain a healthy weight. Excess weight puts extra stress on your back and makes it difficult to maintain good posture. °SEEK MEDICAL CARE IF: °· You have pain that is not relieved with rest or medicine. °· You have increasing pain going down into the legs or buttocks. °· You have pain that does not improve in one week. °· You have night pain. °· You lose weight. °· You have a fever or chills. °SEEK IMMEDIATE MEDICAL CARE IF:  °· You develop new bowel or bladder control problems. °· You have unusual weakness or numbness in your arms or legs. °· You develop nausea or vomiting. °· You develop abdominal pain. °· You feel faint. °  °This information is not intended to replace advice given to you by your health care provider. Make sure you discuss any questions you have with your health care provider. °  °Document Released: 12/27/2004 Document Revised: 01/17/2014 Document Reviewed: 04/30/2013 °Elsevier Interactive Patient Education ©2016 Elsevier   Inc.  Heat Therapy Heat therapy can help ease sore, stiff, injured, and tight muscles and joints. Heat relaxes your muscles, which may help ease your pain. Heat therapy should only be used on old, pre-existing, or long-lasting (chronic) injuries. Do not use heat therapy unless told by your doctor. HOW TO USE HEAT THERAPY There are several different kinds of heat therapy, including:  Moist heat pack.  Warm water bath.  Hot water bottle.  Electric heating  pad.  Heated gel pack.  Heated wrap.  Electric heating pad. GENERAL HEAT THERAPY RECOMMENDATIONS   Do not sleep while using heat therapy. Only use heat therapy while you are awake.  Your skin may turn pink while using heat therapy. Do not use heat therapy if your skin turns red.  Do not use heat therapy if you have new pain.  High heat or long exposure to heat can cause burns. Be careful when using heat therapy to avoid burning your skin.  Do not use heat therapy on areas of your skin that are already irritated, such as with a rash or sunburn. GET HELP IF:   You have blisters, redness, swelling (puffiness), or numbness.  You have new pain.  Your pain is worse. MAKE SURE YOU:  Understand these instructions.  Will watch your condition.  Will get help right away if you are not doing well or get worse.   This information is not intended to replace advice given to you by your health care provider. Make sure you discuss any questions you have with your health care provider.   Document Released: 03/21/2011 Document Revised: 01/17/2014 Document Reviewed: 02/19/2013 Elsevier Interactive Patient Education 2016 Elsevier Inc.  Lumbosacral Strain Lumbosacral strain is a strain of any of the parts that make up your lumbosacral vertebrae. Your lumbosacral vertebrae are the bones that make up the lower third of your backbone. Your lumbosacral vertebrae are held together by muscles and tough, fibrous tissue (ligaments).  CAUSES  A sudden blow to your back can cause lumbosacral strain. Also, anything that causes an excessive stretch of the muscles in the low back can cause this strain. This is typically seen when people exert themselves strenuously, fall, lift heavy objects, bend, or crouch repeatedly. RISK FACTORS  Physically demanding work.  Participation in pushing or pulling sports or sports that require a sudden twist of the back (tennis, golf, baseball).  Weight  lifting.  Excessive lower back curvature.  Forward-tilted pelvis.  Weak back or abdominal muscles or both.  Tight hamstrings. SIGNS AND SYMPTOMS  Lumbosacral strain may cause pain in the area of your injury or pain that moves (radiates) down your leg.  DIAGNOSIS Your health care provider can often diagnose lumbosacral strain through a physical exam. In some cases, you may need tests such as X-ray exams.  TREATMENT  Treatment for your lower back injury depends on many factors that your clinician will have to evaluate. However, most treatment will include the use of anti-inflammatory medicines. HOME CARE INSTRUCTIONS   Avoid hard physical activities (tennis, racquetball, waterskiing) if you are not in proper physical condition for it. This may aggravate or create problems.  If you have a back problem, avoid sports requiring sudden body movements. Swimming and walking are generally safer activities.  Maintain good posture.  Maintain a healthy weight.  For acute conditions, you may put ice on the injured area.  Put ice in a plastic bag.  Place a towel between your skin and the bag.  Leave the ice  on for 20 minutes, 2-3 times a day.  When the low back starts healing, stretching and strengthening exercises may be recommended. SEEK MEDICAL CARE IF:  Your back pain is getting worse.  You experience severe back pain not relieved with medicines. SEEK IMMEDIATE MEDICAL CARE IF:   You have numbness, tingling, weakness, or problems with the use of your arms or legs.  There is a change in bowel or bladder control.  You have increasing pain in any area of the body, including your belly (abdomen).  You notice shortness of breath, dizziness, or feel faint.  You feel sick to your stomach (nauseous), are throwing up (vomiting), or become sweaty.  You notice discoloration of your toes or legs, or your feet get very cold. MAKE SURE YOU:   Understand these instructions.  Will watch  your condition.  Will get help right away if you are not doing well or get worse.   This information is not intended to replace advice given to you by your health care provider. Make sure you discuss any questions you have with your health care provider.   Document Released: 10/06/2004 Document Revised: 01/17/2014 Document Reviewed: 08/15/2012 Elsevier Interactive Patient Education [employer]2016 Elsevier Inc.
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
## 2016-03-22 IMAGING — US US OB COMP LESS 14 WK
1 series · 2 of 2 positions shown · non-contrast
Comparison: None.

CLINICAL DATA: Vaginal bleeding

EXAM:
OBSTETRIC <14 WK US AND TRANSVAGINAL OB US
TECHNIQUE: Both transabdominal and transvaginal ultrasound examinations were
performed for complete evaluation of the gestation as well as the
maternal uterus, adnexal regions, and pelvic cul-de-sac.
Transvaginal technique was performed to assess early pregnancy.

[Series 1: us ob comp less 14 wk · 2 of 2 slices shown]
[im 1/2]
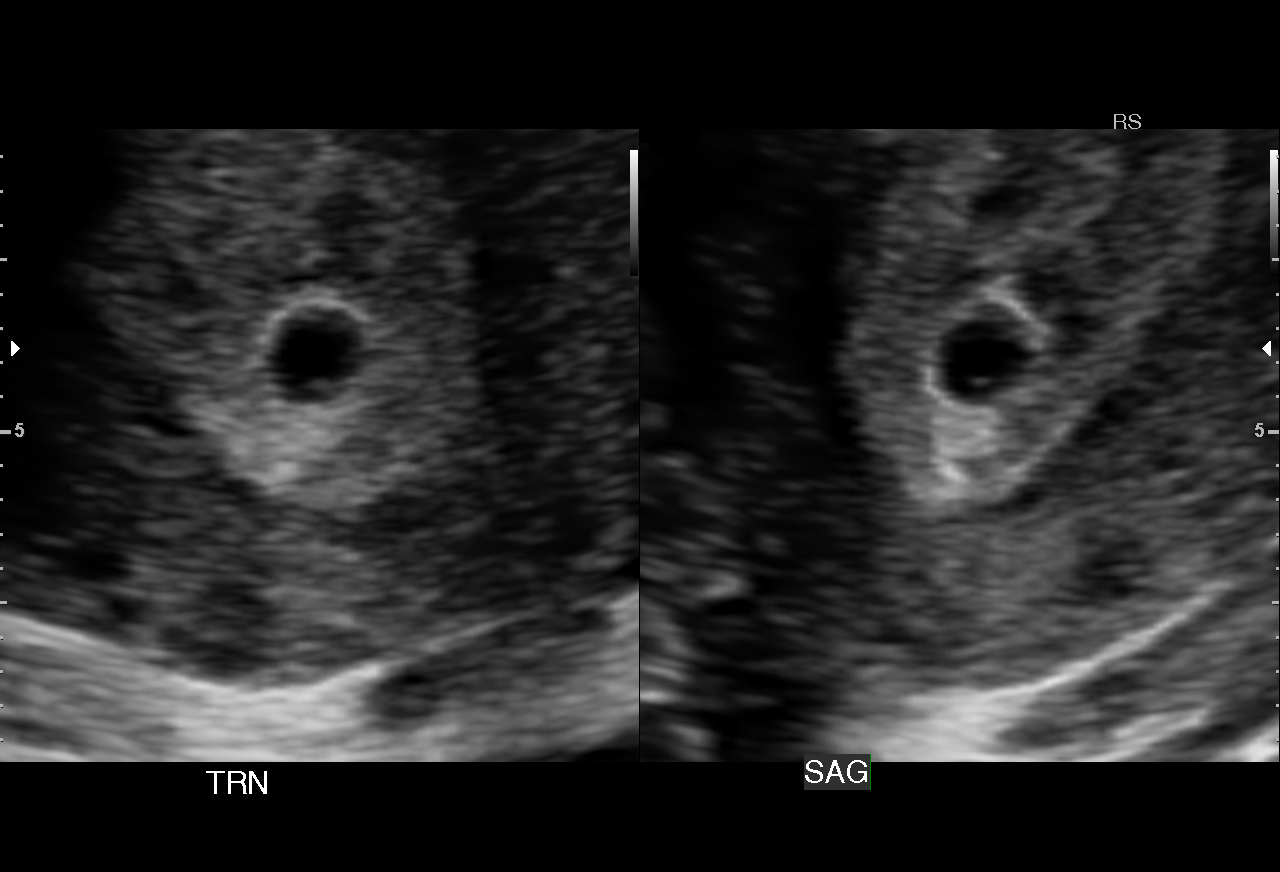
[im 2/2]
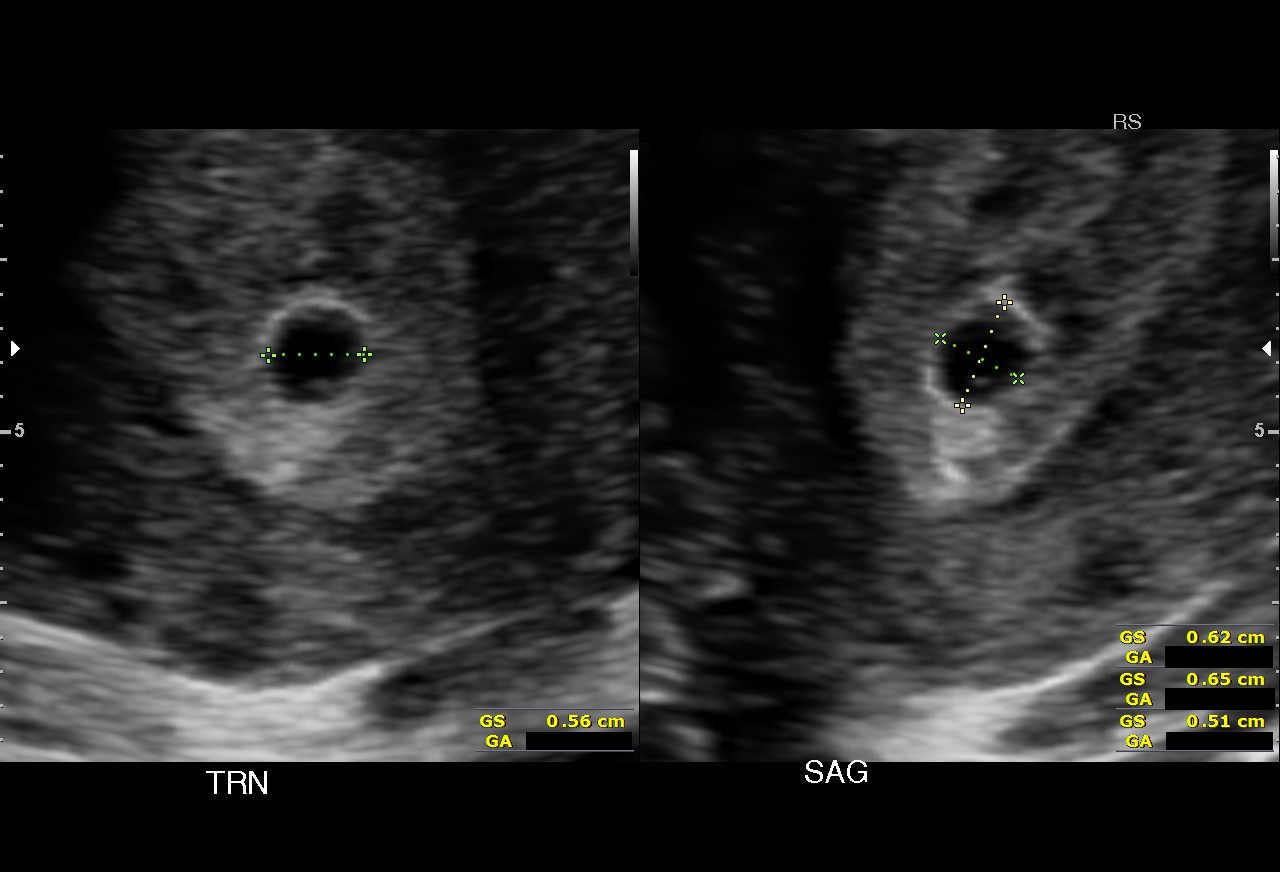

[2 of 2 positions shown; findings below may reference images not displayed]

FINDINGS: Intrauterine gestational sac: Visualized/normal in shape.

Yolk sac:  Visualized

Embryo:  Visualized

Cardiac Activity: Visualized

Heart Rate: 103  bpm

CRL:  2  mm   5 w   0 d

Maternal uterus/adnexae: There is no demonstrable subchorionic
hemorrhage. Cervical os is closed. Maternal ovaries appear
unremarkable bilaterally. Corpus luteum noted on the left. No
maternal free fluid.
IMPRESSION: Single live intrauterine gestation with estimated gestational age of
approximately 5 weeks. No subchorionic hemorrhage appreciable. Study
otherwise unremarkable.
